# Patient Record
Sex: Female | Born: 1960
Health system: Southern US, Community
[De-identification: ages and names within clinical notes are randomized; demographics above are authoritative.]

## PROBLEM LIST (undated history)

## (undated) DIAGNOSIS — E079 Disorder of thyroid, unspecified: Secondary | ICD-10-CM

## (undated) DIAGNOSIS — C439 Malignant melanoma of skin, unspecified: Secondary | ICD-10-CM

## (undated) HISTORY — PX: TUMOR EXCISION: SHX421

## (undated) HISTORY — PX: WISDOM TOOTH EXTRACTION: SHX21

## (undated) HISTORY — PX: LIPOSUCTION: SHX10

## (undated) HISTORY — PX: ABDOMINAL HYSTERECTOMY: SHX81

---

## 2016-07-11 ENCOUNTER — Encounter (HOSPITAL_BASED_OUTPATIENT_CLINIC_OR_DEPARTMENT_OTHER): Payer: Self-pay | Admitting: *Deleted

## 2016-07-11 ENCOUNTER — Emergency Department (HOSPITAL_BASED_OUTPATIENT_CLINIC_OR_DEPARTMENT_OTHER)
Admission: EM | Admit: 2016-07-11 | Discharge: 2016-07-11 | Disposition: A | Discharge status: Home / Self Care | Payer: BLUE CROSS/BLUE SHIELD | Attending: Emergency Medicine | Admitting: Emergency Medicine

## 2016-07-11 DIAGNOSIS — M5412 Radiculopathy, cervical region: Secondary | ICD-10-CM | POA: Diagnosis not present

## 2016-07-11 DIAGNOSIS — M79622 Pain in left upper arm: Secondary | ICD-10-CM | POA: Diagnosis present

## 2016-07-11 HISTORY — DX: Disorder of thyroid, unspecified: E07.9

## 2016-07-11 HISTORY — DX: Malignant melanoma of skin, unspecified: C43.9

## 2016-07-11 MED ORDER — NAPROXEN 375 MG PO TABS: 375.0000 mg | ORAL_TABLET | Freq: Two times a day (BID) | ORAL | 0 refills | Status: DC

## 2016-07-11 MED ORDER — PREDNISONE 20 MG PO TABS: 40.0000 mg | ORAL_TABLET | Freq: Every day | ORAL | 0 refills | Status: DC

## 2016-07-11 MED FILL — predniSONE 20 MG TABS: 20 | 5 days supply | Qty: 10 | Fill #0

## 2016-07-11 MED FILL — NAPROXEN 375 MG TABLET: 375 | 10 days supply | Qty: 20 | Fill #0

## 2016-07-11 NOTE — ED Notes (Signed)
MD at bedside. 

## 2016-07-11 NOTE — ED Notes (Signed)
C/o some L arm hand fingers numb/tingling, pain comes and goes, onset ~ 0540, rates, pain 2/10, CMS/ROM intact, no drift, grips strong bilaterally, (denies: HA, nausea, dizziness, sob).

## 2016-07-11 NOTE — ED Provider Notes (Signed)
[facility] [facility] Provider Note   CSN: [id number] Arrival date & time: 07/11/16  0610     History   Chief Complaint Chief Complaint  Patient presents with  . Numbness    HPI Kathleen Matthews is a 55 y.o. female.  Patient is a 55 year old female with a history of hypothyroid disease presenting today with left arm pain and numbness. Patient woke up at 5:30 this morning with a pain in the back of her arm that intermittently radiates down to the finger and numbness and tingling of the fingers. It has waxed and waned since starting but seems to be improving. Patient works as a Theme park manager and states Saturday was a very stressful day but she always does things with her arms. Since waking up she has felt slightly nauseated but denies any shortness of breath, chest pain, back pain, abdominal pain. She has never had issues with this arm before but does recall having problems with her right shoulder in the past with radicular symptoms requiring physical therapy. She is unsure if this pain today is similar to that radicular pain she had on her right side. She does have a strong family history of cardiac disease with her brother undergoing bypass surgery in his 64s and her father having bypass surgery in his 83s. The patient does not use tobacco or abuse alcohol. She has no known cardiac history. She has been off of her Synthroid for the last 4 months and has been taking an over-the-counter thyroid supplement but denies excessive weight gain, fatigue or other complaints at this time. She states she has just not called her doctor to get her prescription filled.   The history is provided by the patient.    Past Medical History:  Diagnosis Date  . 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 Thyroid disease     There are no active problems to display for this patient.   Past Surgical History:  Procedure Laterality Date  . ABDOMINAL HYSTERECTOMY    . CESAREAN SECTION    . LIPOSUCTION    . TUMOR EXCISION      melanoma, L ankle  . WISDOM TOOTH EXTRACTION      OB History    No data available       Home Medications    Prior to Admission medications   Not on File    Family History History reviewed. No pertinent family history.  Social History Social History  Substance Use Topics  . Smoking status: Never Smoker  . Smokeless tobacco: Never Used  . Alcohol use No     Allergies   Other   Review of Systems Review of Systems  All other systems reviewed and are negative.    Physical Exam Updated Vital Signs BP 144/87 (BP Location: Left Arm)   Pulse 67   Temp 98.1 F (36.7 C) (Oral)   Resp 20   Ht 5\' 1"  (1.549 m)   Wt 122 lb (55.3 kg)   SpO2 99%   BMI 23.05 kg/m   Physical Exam  Constitutional: She is oriented to person, place, and time. She appears well-developed and well-nourished. No distress.  HENT:  Head: Normocephalic and atraumatic.  Mouth/Throat: Oropharynx is clear and moist.  Eyes: Conjunctivae and EOM are normal. Pupils are equal, round, and reactive to light.  Neck: Normal range of motion. Neck supple.  Cardiovascular: Normal rate, regular rhythm and intact distal pulses.   No murmur heard. 2+ left radial pulse.  Equal radial pulses bilaterally  Pulmonary/Chest: Effort normal and breath sounds normal. No respiratory distress. She has no wheezes. She has no rales.  Abdominal: Soft. She exhibits no distension. There is no tenderness. There is no rebound and no guarding.  Musculoskeletal: Normal range of motion. She exhibits no edema or tenderness.  Neurological: She is alert and oriented to person, place, and time.  Minimal subjective tingling in the lateral fingers of the left hand.  5/5 hand grip strength, 5/5 bicep, tripcep strength.  Minimal pain with palpation of the 5th digit and axilla.  Skin: Skin is warm and dry. No rash noted. No erythema.  Psychiatric: She has a normal mood and affect. Her behavior is normal.  Nursing note and vitals  reviewed.    ED Treatments / Results  Labs (all labs ordered are listed, but only abnormal results are displayed) Labs Reviewed - No data to display  EKG  EKG Interpretation  Date/Time:  Monday July 11 2016 07:42:52 EDT Ventricular Rate:  67 PR Interval:    QRS Duration: 86 QT Interval:  426 QTC Calculation: 450 R Axis:   58 Text Interpretation:  Sinus rhythm Low voltage, precordial leads Borderline T abnormalities, anterior leads No previous tracing Confirmed by Maryan Rued  MD, Loree Fee ([id number]) on 07/11/2016 7:52:54 AM       Radiology No results found.  Procedures Procedures (including critical care time)  Medications Ordered in ED Medications - No data to display   Initial Impression / Assessment and Plan / ED Course  I have reviewed the triage vital signs and the nursing notes.  Pertinent labs & imaging results that were available during my care of the patient were reviewed by me and considered in my medical decision making (see chart for details).  Clinical Course   Patient is a 55 year old female presenting today with left arm pain and numbness most ingested of radicular symptoms. However patient has a strong family history of cardiac disease and was concerned which is why she came in. She denies any shortness of breath, cough or respiratory complaints. She has had no chest pain. She feels slightly nauseated however she attributes that to not getting her normal amount of sleep. She has not had any vomiting or diaphoresis. She has had radicular symptoms in her right upper extremity due to her occupation which is being a Theme park manager. She does recall Saturday being a very stressful busy day where she did a lot with her upper body. However yesterday was much more relaxed. She does not recall sleeping on the left side but symptoms of been persistent since waking up. They do seem to be improving. Patient's vital signs are within normal limits. She has been out of her thyroid  replacement for the last 4 months but has been taking OTC meds. She denies any symptoms of hypothyroidism and just needs to call her doctor to get a new prescription. We'll do a screening EKG given patient's family history however symptoms are typical of radiculopathy in the C8 distribution.  EKG with mild t-wave flattening anteriorly but no other abnormalities and no signs of MI.  Final Clinical Impressions(s) / ED Diagnoses   Final diagnoses:  Cervical radiculopathy    New Prescriptions New Prescriptions   NAPROXEN (NAPROSYN) 375 MG TABLET    Take 1 tablet (375 mg total) by mouth 2 (two) times daily.   PREDNISONE (DELTASONE) 20 MG TABLET    Take 2 tablets (40 mg total) by mouth daily.     Blanchie Dessert, MD 07/11/16 [phone]

## 2016-07-11 NOTE — ED Notes (Signed)
Pt directed to pharmacy to pick up prescriptions. Ambulatory with steady gait to d/c window

## 2017-02-14 ENCOUNTER — Other Ambulatory Visit: Payer: Self-pay | Admitting: Family Medicine

## 2017-02-14 DIAGNOSIS — Z1231 Encounter for screening mammogram for malignant neoplasm of breast: Secondary | ICD-10-CM

## 2017-03-01 ENCOUNTER — Ambulatory Visit: Payer: BLUE CROSS/BLUE SHIELD

## 2018-03-18 ENCOUNTER — Other Ambulatory Visit: Payer: Self-pay | Admitting: Family Medicine

## 2018-03-18 DIAGNOSIS — Z1231 Encounter for screening mammogram for malignant neoplasm of breast: Secondary | ICD-10-CM

## 2018-05-31 ENCOUNTER — Ambulatory Visit
Admission: RE | Admit: 2018-05-31 | Discharge: 2018-05-31 | Disposition: A | Discharge status: Home / Self Care | Payer: BLUE CROSS/BLUE SHIELD | Source: Ambulatory Visit | Attending: Family Medicine | Admitting: Family Medicine

## 2018-05-31 DIAGNOSIS — Z1231 Encounter for screening mammogram for malignant neoplasm of breast: Secondary | ICD-10-CM

## 2019-07-23 ENCOUNTER — Other Ambulatory Visit: Payer: Self-pay | Admitting: Family Medicine

## 2019-07-23 ENCOUNTER — Other Ambulatory Visit: Payer: Self-pay | Admitting: Pediatrics

## 2019-07-23 DIAGNOSIS — Z1231 Encounter for screening mammogram for malignant neoplasm of breast: Secondary | ICD-10-CM

## 2019-09-09 ENCOUNTER — Ambulatory Visit
Admission: RE | Admit: 2019-09-09 | Discharge: 2019-09-09 | Disposition: A | Discharge status: Home / Self Care | Payer: BLUE CROSS/BLUE SHIELD | Source: Ambulatory Visit | Attending: Family Medicine | Admitting: Family Medicine

## 2019-09-09 ENCOUNTER — Other Ambulatory Visit: Payer: Self-pay

## 2019-09-09 DIAGNOSIS — Z1231 Encounter for screening mammogram for malignant neoplasm of breast: Secondary | ICD-10-CM

## 2020-04-01 IMAGING — MG DIGITAL SCREENING BILAT W/ TOMO W/ CAD
8 series · 8 of 24 positions shown · non-contrast
Comparison: Previous exam(s).

CLINICAL DATA: Screening.

EXAM:
DIGITAL SCREENING BILATERAL MAMMOGRAM WITH TOMO AND CAD

[L CC synth-2D]
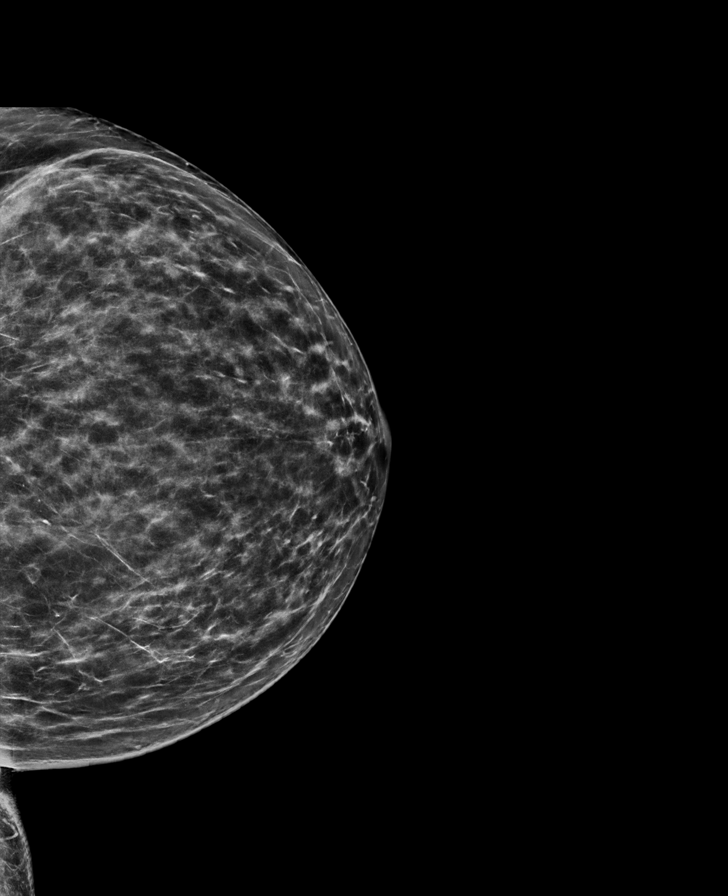

[R MLO synth-2D]
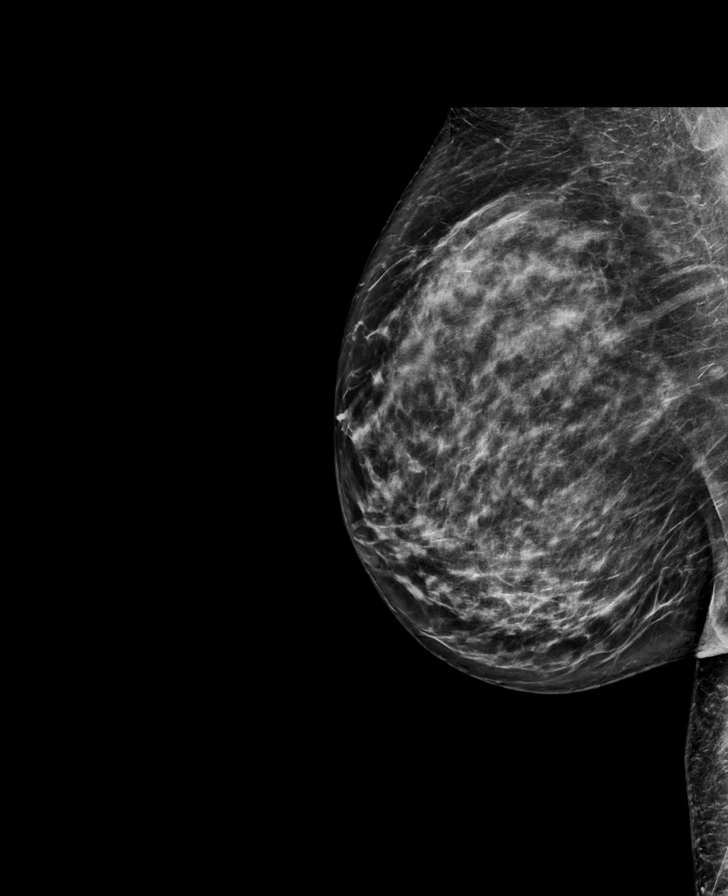

[L MLO synth-2D]
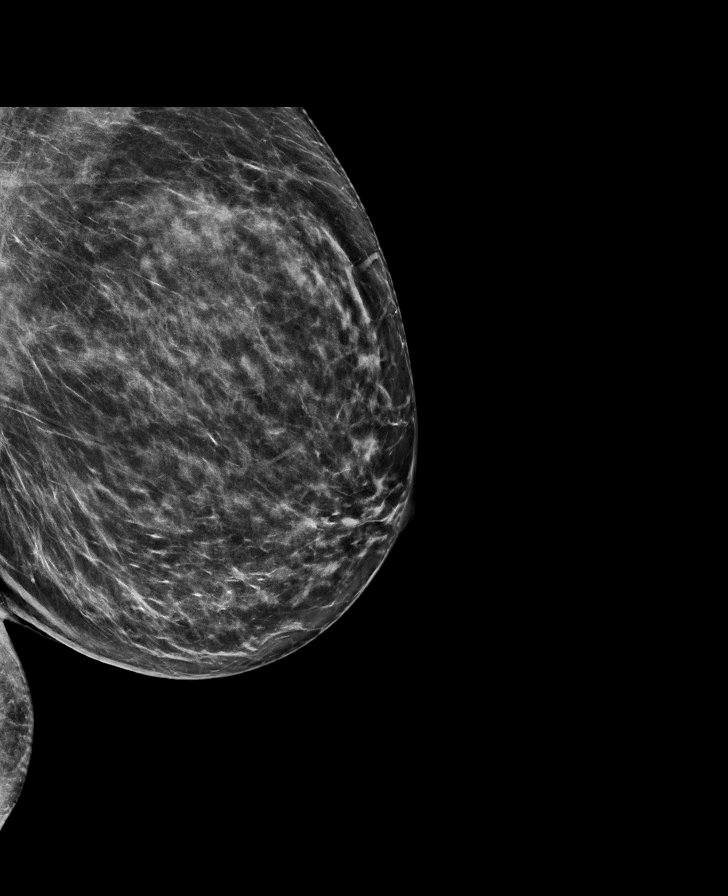

[R CC synth-2D]
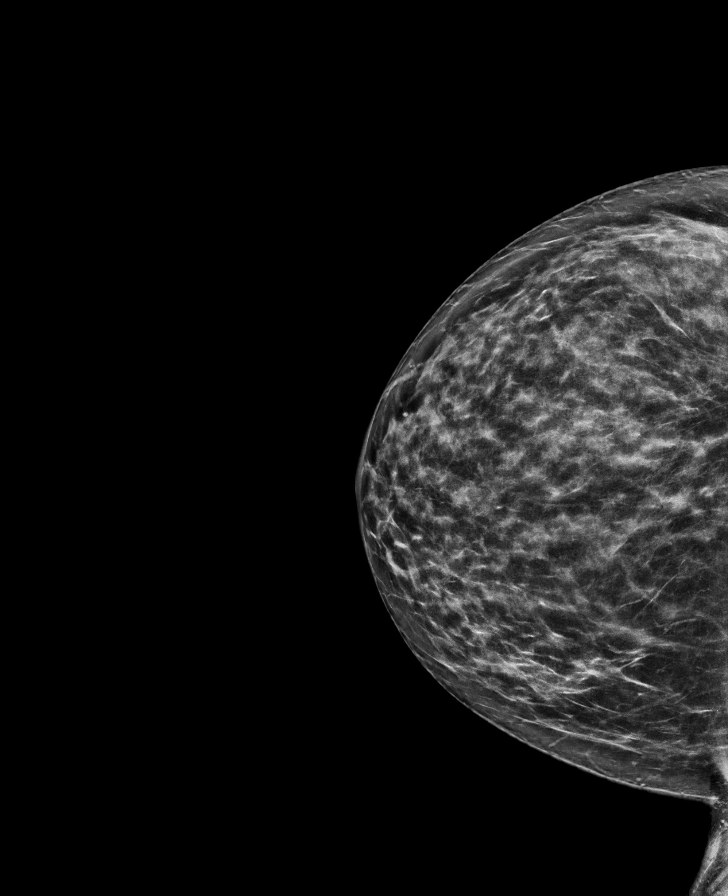

[L CC tomo · tomo slice 35/70.0]
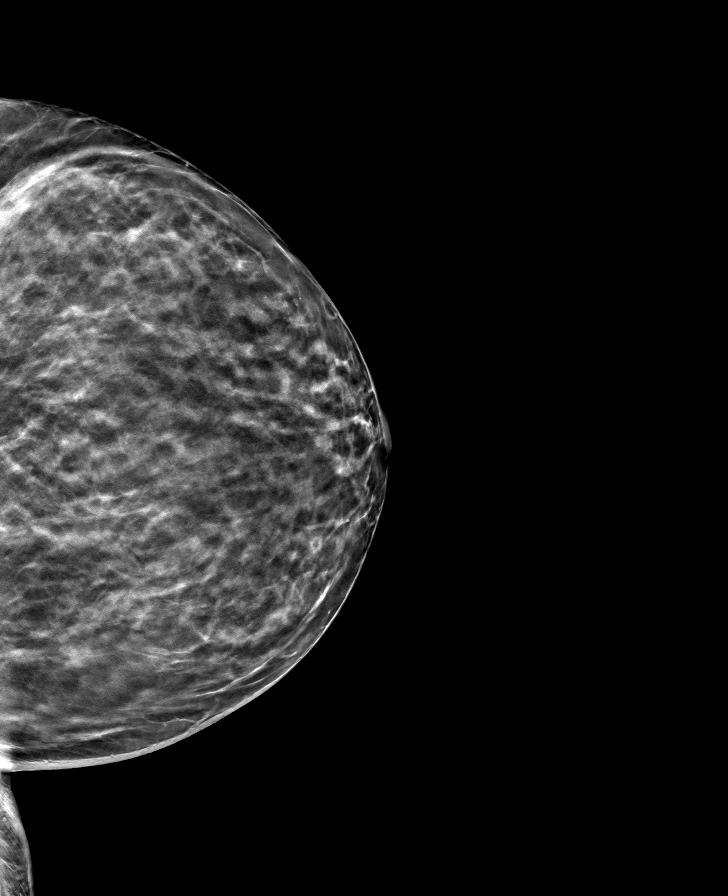

[R MLO tomo · tomo slice 39/78.0]
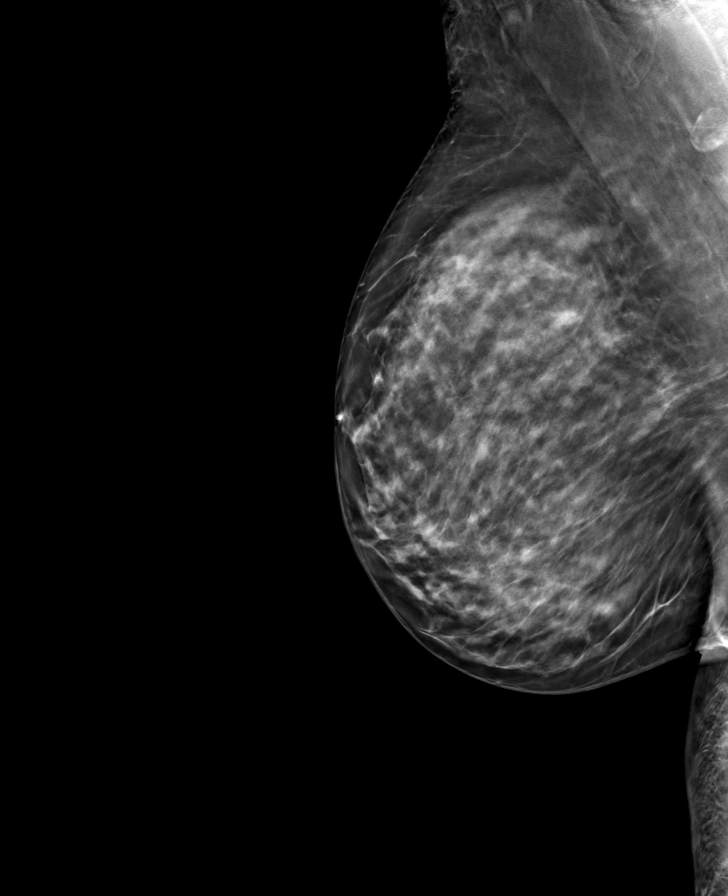

[L MLO tomo · tomo slice 39/77.0]
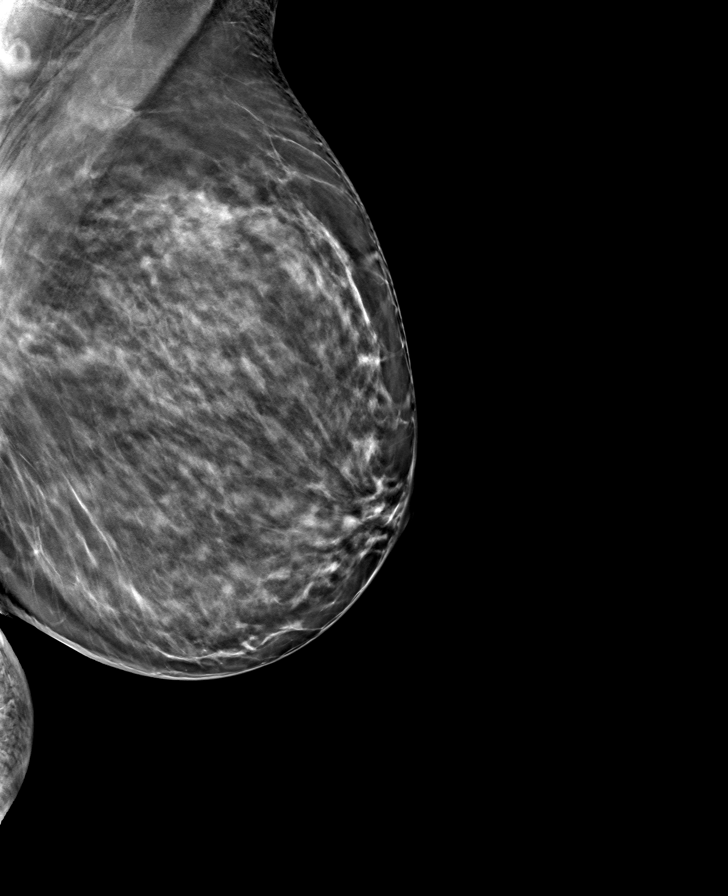

[R CC tomo · tomo slice 35/69.0]
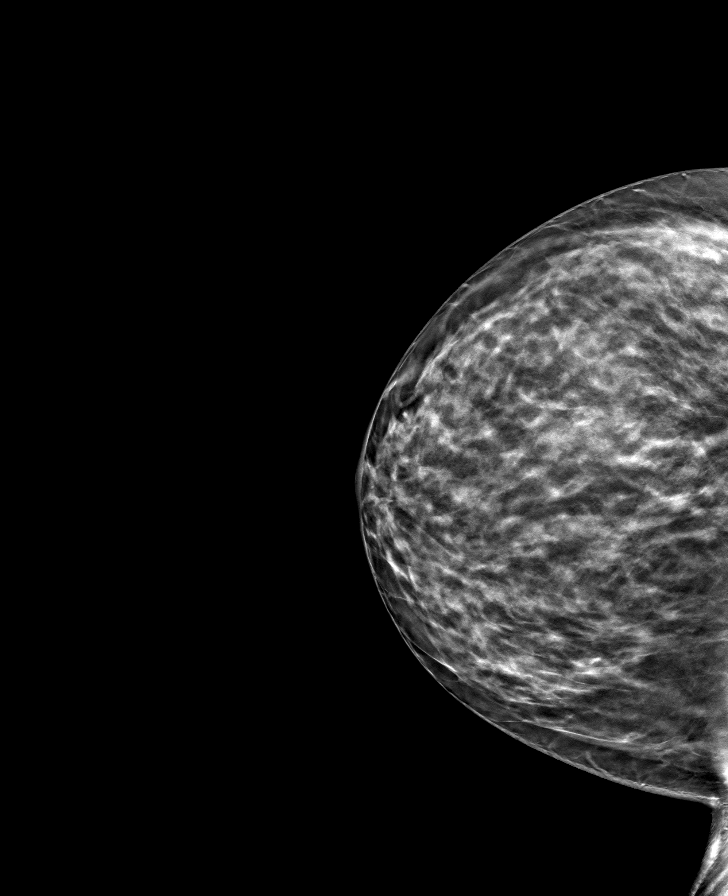

[8 of 24 positions shown; findings below may reference images not displayed]

ACR Breast Density Category c: The breast tissue is heterogeneously
dense, which may obscure small masses.
FINDINGS: There are no findings suspicious for malignancy. Images were
processed with CAD.
IMPRESSION: No mammographic evidence of malignancy. A result letter of this
screening mammogram will be mailed directly to the patient.

RECOMMENDATION:
Screening mammogram in one year. (Code:FT-U-LHB)

BI-RADS CATEGORY  1: Negative.

## 2021-03-26 ENCOUNTER — Other Ambulatory Visit: Payer: Self-pay | Admitting: Family Medicine

## 2021-03-26 DIAGNOSIS — Z1231 Encounter for screening mammogram for malignant neoplasm of breast: Secondary | ICD-10-CM

## 2021-05-27 ENCOUNTER — Ambulatory Visit
Admission: RE | Admit: 2021-05-27 | Discharge: 2021-05-27 | Disposition: A | Discharge status: Home / Self Care | Payer: BC Managed Care – PPO | Source: Ambulatory Visit | Attending: Family Medicine | Admitting: Family Medicine

## 2021-05-27 ENCOUNTER — Other Ambulatory Visit: Payer: Self-pay

## 2021-05-27 DIAGNOSIS — Z1231 Encounter for screening mammogram for malignant neoplasm of breast: Secondary | ICD-10-CM

## 2021-07-31 ENCOUNTER — Emergency Department (HOSPITAL_BASED_OUTPATIENT_CLINIC_OR_DEPARTMENT_OTHER)
Admission: EM | Admit: 2021-07-31 | Discharge: 2021-07-31 | Disposition: A | Discharge status: Home / Self Care | Payer: BC Managed Care – PPO | Attending: Emergency Medicine | Admitting: Emergency Medicine

## 2021-07-31 ENCOUNTER — Other Ambulatory Visit: Payer: Self-pay

## 2021-07-31 ENCOUNTER — Encounter (HOSPITAL_BASED_OUTPATIENT_CLINIC_OR_DEPARTMENT_OTHER): Payer: Self-pay | Admitting: *Deleted

## 2021-07-31 ENCOUNTER — Emergency Department (HOSPITAL_BASED_OUTPATIENT_CLINIC_OR_DEPARTMENT_OTHER): Payer: BC Managed Care – PPO

## 2021-07-31 DIAGNOSIS — R519 Headache, unspecified: Secondary | ICD-10-CM | POA: Diagnosis present

## 2021-07-31 DIAGNOSIS — R262 Difficulty in walking, not elsewhere classified: Secondary | ICD-10-CM | POA: Insufficient documentation

## 2021-07-31 DIAGNOSIS — Z85820 Personal history of malignant melanoma of skin: Secondary | ICD-10-CM | POA: Diagnosis not present

## 2021-07-31 DIAGNOSIS — H5711 Ocular pain, right eye: Secondary | ICD-10-CM | POA: Diagnosis not present

## 2021-07-31 MED ORDER — ONDANSETRON 4 MG PO TBDP
4.0000 mg | ORAL_TABLET | Freq: Three times a day (TID) | ORAL | 0 refills | Status: AC | PRN
Start: 2021-07-31 — End: ?

## 2021-07-31 MED ORDER — TETRACAINE HCL 0.5 % OP SOLN
2.0000 [drp] | Freq: Once | OPHTHALMIC | Status: AC
  Administered 2021-07-31: 2 [drp] via OPHTHALMIC
  Filled 2021-07-31: qty 4

## 2021-07-31 MED ORDER — ONDANSETRON 4 MG PO TBDP
4.0000 mg | ORAL_TABLET | Freq: Once | ORAL | Status: AC
  Administered 2021-07-31: 4 mg via ORAL
  Filled 2021-07-31: qty 1

## 2021-07-31 NOTE — ED Triage Notes (Signed)
Pt reports headache last Sunday, starting behind right eye, then moved over to left eye. States  "Usually it does". States she had the headache Tuesday, Thursday and Friday also. Report it returned this am and she had some relief with claritin-D. She reports pain in the teeth and she thinks it is "sinuses". Today she was advised by tele-health from her work to be checked for glaucoma

## 2021-07-31 NOTE — ED Provider Notes (Signed)
[facility] EMERGENCY DEPARTMENT Provider Note   CSN: [id number] Arrival date & time: 07/31/21  1804     History Chief Complaint  Patient presents with   Headache    Kathleen Matthews is a 60 y.o. female.  HPI     Headache started last Sunday Hard pain Was taking claritin D daytime , and stopped the nighttime one, and then stopped the claritin D and feels like symptoms worsened since then  Face hurt, teeth hurt, usually sinuses wihen that happens  Usual  2 days in a row at 3PM felt like jabbing pain right eyeball, left eye with visual changes at the bottom of vision, like rising eat, waves, like distorted glass, lasts until pain in eye goes away.  Left eye has been having visual changes off and on over the last week.   Has been having headaches for a long time off and on but new stabbing type headache and visual changes in left eye  In [place] thought had allergy induced migraines, didn't have visual changes with those, just severe headache   No numbness/weakness/difficulty talking Ears feel full "When head messed up " has some difficulty walking, feels like grazes walls sometimes but no overt difficulty walking or continuing symptoms when not having headache.  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   Past Surgical History:  Procedure Laterality Date   ABDOMINAL HYSTERECTOMY     CESAREAN SECTION     LIPOSUCTION     TUMOR EXCISION     melanoma, L ankle   WISDOM TOOTH EXTRACTION       OB History   No obstetric history on file.     No family history on file.  Social History   Tobacco Use   Smoking status: Never   Smokeless tobacco: Never   Substance Use Topics   Alcohol use: No   Drug use: No    Home Medications Prior to Admission medications   Medication Sig Start Date End Date Taking? Authorizing Provider  ondansetron (ZOFRAN ODT) 4 MG disintegrating tablet Take 1 tablet (4 mg total) by mouth every 8 (eight) hours as needed for nausea or vomiting. 07/31/21  Yes Gareth Morgan, MD  naproxen (NAPROSYN) 375 MG tablet Take 1 tablet (375 mg total) by mouth 2 (two) times daily. 07/11/16   Blanchie Dessert, MD  predniSONE (DELTASONE) 20 MG tablet Take 2 tablets (40 mg total) by mouth daily. 07/11/16   Blanchie Dessert, MD    Allergies    Other  Review of Systems   Review of Systems  Constitutional:  Positive for fatigue.  HENT:  Negative for congestion.   Eyes:  Positive for visual disturbance. Negative for pain.  Respiratory:  Negative for shortness of breath. Cough: occ.  Cardiovascular:  Negative for chest pain.  Gastrointestinal:  Positive for nausea. Negative for vomiting.  Skin:  Negative for rash.  Neurological:  Positive for headaches. Negative for syncope, facial asymmetry, speech difficulty, weakness and numbness.   Physical Exam Updated Vital Signs BP (!) 148/66   Pulse 76   Temp 98.4 F (36.9 C) (Oral)   Resp 18  Ht 5\' 1"  (1.549 m)   Wt 54.4 kg   SpO2 100%   BMI 22.67 kg/m   Physical Exam Vitals and nursing note reviewed.  Constitutional:      General: She is not in acute distress.    Appearance: She is well-developed. She is not diaphoretic.  HENT:     Head: Normocephalic and atraumatic.  Eyes:     Conjunctiva/sclera: Conjunctivae normal.     Comments: IOP 21  Cardiovascular:     Rate and Rhythm: Normal rate and regular rhythm.     Heart sounds: Normal heart sounds. No murmur heard.   No friction rub. No gallop.  Pulmonary:     Effort: Pulmonary effort is normal. No respiratory distress.     Breath sounds: Normal breath sounds. No wheezing or rales.  Abdominal:     General: There  is no distension.     Palpations: Abdomen is soft.     Tenderness: There is no abdominal tenderness. There is no guarding.  Musculoskeletal:        General: No tenderness.     Cervical back: Normal range of motion.  Skin:    General: Skin is warm and dry.     Findings: No erythema or rash.  Neurological:     Mental Status: She is alert and oriented to person, place, and time.    ED Results / Procedures / Treatments   Labs (all labs ordered are listed, but only abnormal results are displayed) Labs Reviewed - No data to display  EKG None  Radiology CT Head Wo Contrast  Result Date: 07/31/2021 CLINICAL DATA:  Headache. EXAM: CT HEAD WITHOUT CONTRAST TECHNIQUE: Contiguous axial images were obtained from the base of the skull through the vertex without intravenous contrast. COMPARISON:  None. FINDINGS: Brain: No evidence of acute infarction, hemorrhage, hydrocephalus, extra-axial collection or mass lesion/mass effect. Vascular: No hyperdense vessel or unexpected calcification. Skull: Normal. Negative for fracture or focal lesion. Sinuses/Orbits: No acute finding. Other: None. IMPRESSION: No acute intracranial pathology. Electronically Signed   By: Virgina Norfolk M.D.   On: 07/31/2021 22:27    Procedures Procedures   Medications Ordered in ED Medications  tetracaine (PONTOCAINE) 0.5 % ophthalmic solution 2 drop (2 drops Both Eyes Given 07/31/21 2327)  ondansetron (ZOFRAN-ODT) disintegrating tablet 4 mg (4 mg Oral Given 07/31/21 2331)    ED Course  I have reviewed the triage vital signs and the nursing notes.  Pertinent labs & imaging results that were available during my care of the patient were reviewed by me and considered in my medical decision making (see chart for details).    MDM Rules/Calculators/A&P                           60yo female with history of melanoma, thyroid disease presents with concern for headache located around the right eye, nausea referred from  tele health.  IOP normal, no sign of acute angle closure glaucoma.  Does not have focal findings on exam to suggest CVA, including normal EOM, normal visual fields.  Had a stabbing headache, not described as sudden onset worst headache of life, and improved, and in setting of normal CT head have low suspicion for ICH and after discussion with patient agree to not pursue further CTA imaging, and also do not feel lumbar puncture indicated.  Doubt dural venous thrombosis, meningitis.  Describes sinus pain but does not have signs of sinusitis on CT.  Recommend  continued outpatient follow up for headaches, consideration of outpatient MRI.  Visual changes described with headache sound like they could be migrainous, however recommend close outpatient follow up. Given rx for zofran. Patient discharged in stable condition with understanding of reasons to return.     Final Clinical Impression(s) / ED Diagnoses Final diagnoses:  Acute nonintractable headache, unspecified headache type    Rx / DC Orders ED Discharge Orders          Ordered    ondansetron (ZOFRAN ODT) 4 MG disintegrating tablet  Every 8 hours PRN        07/31/21 2328             Gareth Morgan, MD 08/01/21 1148

## 2022-09-08 ENCOUNTER — Other Ambulatory Visit: Payer: Self-pay | Admitting: Family Medicine

## 2022-09-08 DIAGNOSIS — Z1231 Encounter for screening mammogram for malignant neoplasm of breast: Secondary | ICD-10-CM

## 2022-09-09 ENCOUNTER — Ambulatory Visit
Admission: RE | Admit: 2022-09-09 | Discharge: 2022-09-09 | Disposition: A | Discharge status: Home / Self Care | Payer: BC Managed Care – PPO | Source: Ambulatory Visit | Attending: Family Medicine | Admitting: Family Medicine

## 2022-09-09 DIAGNOSIS — Z1231 Encounter for screening mammogram for malignant neoplasm of breast: Secondary | ICD-10-CM

## 2022-09-13 ENCOUNTER — Other Ambulatory Visit: Payer: Self-pay | Admitting: Family Medicine

## 2022-09-13 DIAGNOSIS — R928 Other abnormal and inconclusive findings on diagnostic imaging of breast: Secondary | ICD-10-CM

## 2022-09-29 ENCOUNTER — Other Ambulatory Visit: Payer: BC Managed Care – PPO

## 2022-10-20 ENCOUNTER — Other Ambulatory Visit: Payer: BC Managed Care – PPO

## 2022-10-21 ENCOUNTER — Other Ambulatory Visit: Payer: BC Managed Care – PPO

## 2022-12-02 ENCOUNTER — Ambulatory Visit
Admission: RE | Admit: 2022-12-02 | Discharge: 2022-12-02 | Disposition: A | Discharge status: Home / Self Care | Payer: BC Managed Care – PPO | Source: Ambulatory Visit | Attending: Family Medicine | Admitting: Family Medicine

## 2022-12-02 ENCOUNTER — Ambulatory Visit: Admission: RE | Admit: 2022-12-02 | Payer: BC Managed Care – PPO | Source: Ambulatory Visit

## 2022-12-02 DIAGNOSIS — R928 Other abnormal and inconclusive findings on diagnostic imaging of breast: Secondary | ICD-10-CM

## 2023-01-06 ENCOUNTER — Other Ambulatory Visit: Payer: Self-pay | Admitting: Family Medicine

## 2023-01-06 DIAGNOSIS — R1011 Right upper quadrant pain: Secondary | ICD-10-CM

## 2023-01-20 ENCOUNTER — Ambulatory Visit
Admission: RE | Admit: 2023-01-20 | Discharge: 2023-01-20 | Disposition: A | Discharge status: Home / Self Care | Payer: BC Managed Care – PPO | Source: Ambulatory Visit | Attending: Family Medicine | Admitting: Family Medicine

## 2023-01-20 DIAGNOSIS — R1011 Right upper quadrant pain: Secondary | ICD-10-CM

## 2023-09-28 ENCOUNTER — Ambulatory Visit: Payer: BC Managed Care – PPO | Admitting: Internal Medicine

## 2023-10-10 ENCOUNTER — Encounter (HOSPITAL_BASED_OUTPATIENT_CLINIC_OR_DEPARTMENT_OTHER): Payer: Self-pay

## 2023-10-25 ENCOUNTER — Telehealth (INDEPENDENT_AMBULATORY_CARE_PROVIDER_SITE_OTHER): Payer: Self-pay | Admitting: Otolaryngology

## 2023-10-25 NOTE — Telephone Encounter (Signed)
 Confirmed appt & location [id number]

## 2023-10-26 ENCOUNTER — Ambulatory Visit (INDEPENDENT_AMBULATORY_CARE_PROVIDER_SITE_OTHER): Payer: BC Managed Care – PPO | Admitting: Otolaryngology

## 2023-10-26 ENCOUNTER — Encounter (INDEPENDENT_AMBULATORY_CARE_PROVIDER_SITE_OTHER): Payer: Self-pay

## 2023-10-26 VITALS — BP 145/82 | HR 74 | Ht 61.0 in | Wt 126.0 lb

## 2023-10-26 DIAGNOSIS — J310 Chronic rhinitis: Secondary | ICD-10-CM

## 2023-10-26 DIAGNOSIS — H6993 Unspecified Eustachian tube disorder, bilateral: Secondary | ICD-10-CM | POA: Diagnosis not present

## 2023-10-26 DIAGNOSIS — J343 Hypertrophy of nasal turbinates: Secondary | ICD-10-CM | POA: Diagnosis not present

## 2023-10-26 DIAGNOSIS — H6983 Other specified disorders of Eustachian tube, bilateral: Secondary | ICD-10-CM

## 2023-10-26 DIAGNOSIS — R0981 Nasal congestion: Secondary | ICD-10-CM

## 2023-10-27 DIAGNOSIS — J310 Chronic rhinitis: Secondary | ICD-10-CM | POA: Insufficient documentation

## 2023-10-27 DIAGNOSIS — J343 Hypertrophy of nasal turbinates: Secondary | ICD-10-CM | POA: Insufficient documentation

## 2023-10-27 DIAGNOSIS — H6983 Other specified disorders of Eustachian tube, bilateral: Secondary | ICD-10-CM | POA: Insufficient documentation

## 2023-10-27 NOTE — Progress Notes (Signed)
Patient ID: Kathleen Matthews, female   DOB: 1961/10/03, 63 y.o.   MRN: [id number]  Follow-up: Bilateral eustachian tube dysfunction, middle ear effusion  HPI: The patient is a 63 year old female who returns today for follow-up evaluation.  She was last seen in July 2024.  At that time, she was complaining of clogging sensation in her ears.  She was noted to have bilateral eustachian tube dysfunction.  Her hearing test showed bilateral mild high-frequency sensorineural hearing loss.  The patient returns today reporting 1 episode of sinusitis in December 2024.  She was diagnosed with bilateral middle ear effusion.  She has been using Flonase nasal spray intermittently.  She continues to have occasional fluid sensation in her ears.  She denies any significant otalgia, otorrhea, or vertigo.  Exam: General: Communicates without difficulty, well nourished, no acute distress. Head: Normocephalic, no evidence injury, no tenderness, facial buttresses intact without stepoff. Face/sinus: No tenderness to palpation and percussion. Facial movement is normal and symmetric. Eyes: PERRL, EOMI. No scleral icterus, conjunctivae clear. Neuro: CN II exam reveals vision grossly intact.  No nystagmus at any point of gaze. Ears: Auricles well formed without lesions.  Ear canals are intact without mass or lesion.  No erythema or edema is appreciated.  The TMs are intact without fluid. Nose: External evaluation reveals normal support and skin without lesions.  Dorsum is intact.  Anterior rhinoscopy reveals congested mucosa over anterior aspect of inferior turbinates and intact septum.  No purulence noted. Oral:  Oral cavity and oropharynx are intact, symmetric, without erythema or edema.  Mucosa is moist without lesions. Neck: Full range of motion without pain.  There is no significant lymphadenopathy.  No masses palpable.  Thyroid bed within normal limits to palpation.  Parotid glands and submandibular glands equal bilaterally without  mass.  Trachea is midline. Neuro:  CN 2-12 grossly intact.   Assessment: 1.  The patient's history is consistent with bilateral eustachian tube dysfunction. 2.  Chronic rhinitis with nasal mucosal congestion and bilateral inferior turbinate hypertrophy. 3.  Her ear canals, tympanic membranes, and middle ear spaces are normal today.  No middle ear effusion is noted.  Plan: 1.  The physical exam findings are reviewed with the patient. 2.  The patient is reassured that her middle ear effusion has resolved. 3.  Flonase nasal spray 2 sprays each nostril daily.  The importance of consistent daily use is discussed. 4.  Valsalva exercise multiple times a day. 5.  The patient will return for reevaluation in 6 weeks.

## 2023-12-07 ENCOUNTER — Telehealth (INDEPENDENT_AMBULATORY_CARE_PROVIDER_SITE_OTHER): Payer: Self-pay | Admitting: Otolaryngology

## 2023-12-07 NOTE — Telephone Encounter (Signed)
 Left vm to confirm appt and address for 12/08/2023.

## 2023-12-08 ENCOUNTER — Ambulatory Visit (INDEPENDENT_AMBULATORY_CARE_PROVIDER_SITE_OTHER): Payer: BC Managed Care – PPO | Admitting: Otolaryngology

## 2023-12-08 ENCOUNTER — Encounter (INDEPENDENT_AMBULATORY_CARE_PROVIDER_SITE_OTHER): Payer: Self-pay

## 2023-12-08 VITALS — BP 153/78 | Ht 61.0 in | Wt 126.0 lb

## 2023-12-08 DIAGNOSIS — R0981 Nasal congestion: Secondary | ICD-10-CM

## 2023-12-08 DIAGNOSIS — H6980 Other specified disorders of Eustachian tube, unspecified ear: Secondary | ICD-10-CM | POA: Diagnosis not present

## 2023-12-08 DIAGNOSIS — J343 Hypertrophy of nasal turbinates: Secondary | ICD-10-CM | POA: Diagnosis not present

## 2023-12-08 DIAGNOSIS — J310 Chronic rhinitis: Secondary | ICD-10-CM

## 2023-12-08 DIAGNOSIS — H6983 Other specified disorders of Eustachian tube, bilateral: Secondary | ICD-10-CM

## 2023-12-10 NOTE — Progress Notes (Signed)
 Patient ID: Kathleen Matthews, female   DOB: [birth year], 63 y.o.   MRN: [id number]  Follow-up: Bilateral eustachian tube dysfunction  HPI: The patient is a 63 year old female who returns today for her follow-up evaluation.  The patient has a history of bilateral eustachian tube dysfunction and middle ear effusion.  At her last visit in January 2025, her middle ear effusion had resolved.  She was continued on her Flonase nasal spray and Valsalva exercise.  The patient returns today reporting improvement in her eustachian tube dysfunction.  The clogging sensation in her ears has decreased.  She denies any recent sinusitis.  She is able to breathe through both nostrils.  Exam: General: Communicates without difficulty, well nourished, no acute distress. Head: Normocephalic, no evidence injury, no tenderness, facial buttresses intact without stepoff. Face/sinus: No tenderness to palpation and percussion. Facial movement is normal and symmetric. Eyes: PERRL, EOMI. No scleral icterus, conjunctivae clear. Neuro: CN II exam reveals vision grossly intact.  No nystagmus at any point of gaze. Ears: Auricles well formed without lesions.  Ear canals are intact without mass or lesion.  No erythema or edema is appreciated.  The TMs are intact without fluid. Nose: External evaluation reveals normal support and skin without lesions.  Dorsum is intact.  Anterior rhinoscopy reveals congested mucosa over anterior aspect of inferior turbinates and intact septum.  No purulence noted. Oral:  Oral cavity and oropharynx are intact, symmetric, without erythema or edema.  Mucosa is moist without lesions. Neck: Full range of motion without pain.  There is no significant lymphadenopathy.  No masses palpable.  Thyroid bed within normal limits to palpation.  Parotid glands and submandibular glands equal bilaterally without mass.  Trachea is midline. Neuro:  CN 2-12 grossly intact.    Assessment: 1.  Clinically improved eustachian tube  dysfunction. 2.  Chronic rhinitis with nasal mucosal congestion and bilateral inferior turbinate hypertrophy.  The severity of her nasal congestion has decreased.  Plan: 1.  The physical exam findings are reviewed with the patient. 2.  Continue with Flonase nasal spray as needed. 3.  The patient is encouraged to call with any questions or concerns.

## 2024-02-15 ENCOUNTER — Encounter (HOSPITAL_BASED_OUTPATIENT_CLINIC_OR_DEPARTMENT_OTHER): Payer: Self-pay

## 2024-03-20 ENCOUNTER — Other Ambulatory Visit: Payer: Self-pay | Admitting: Family Medicine

## 2024-03-20 DIAGNOSIS — Z1231 Encounter for screening mammogram for malignant neoplasm of breast: Secondary | ICD-10-CM

## 2024-07-08 ENCOUNTER — Encounter (HOSPITAL_BASED_OUTPATIENT_CLINIC_OR_DEPARTMENT_OTHER): Payer: Self-pay

## 2024-07-09 ENCOUNTER — Encounter (HOSPITAL_BASED_OUTPATIENT_CLINIC_OR_DEPARTMENT_OTHER): Payer: Self-pay | Admitting: Internal Medicine

## 2024-07-09 ENCOUNTER — Ambulatory Visit (INDEPENDENT_AMBULATORY_CARE_PROVIDER_SITE_OTHER): Admitting: Internal Medicine

## 2024-07-09 VITALS — BP 122/60 | HR 69 | Ht 61.0 in | Wt 135.0 lb

## 2024-07-09 DIAGNOSIS — M791 Myalgia, unspecified site: Secondary | ICD-10-CM

## 2024-07-09 DIAGNOSIS — E781 Pure hyperglyceridemia: Secondary | ICD-10-CM | POA: Diagnosis not present

## 2024-07-09 DIAGNOSIS — T466X5A Adverse effect of antihyperlipidemic and antiarteriosclerotic drugs, initial encounter: Secondary | ICD-10-CM

## 2024-07-09 DIAGNOSIS — T466X5D Adverse effect of antihyperlipidemic and antiarteriosclerotic drugs, subsequent encounter: Secondary | ICD-10-CM

## 2024-07-09 DIAGNOSIS — Z79899 Other long term (current) drug therapy: Secondary | ICD-10-CM | POA: Diagnosis not present

## 2024-07-09 NOTE — Progress Notes (Signed)
 LIPID CLINIC CONSULT NOTE  Chief Complaint:  Manage dyslipidemia  Primary Care Physician: Kathleen Darice CROME, MD  Primary Cardiologist:  None  HPI:  Kathleen Matthews is a 63 y.o. female who is being seen today for the evaluation of dyslipidemia at the request of Kathleen, Darice CROME, MD. this a pleasant 63 year old female kindly referred for evaluation management of dyslipidemia.  Primarily she has a history of high triglycerides and was noted on labs recently to have total cholesterol 284, triglycerides 989 and HDL 34.  LDL was not calculated due to high triglycerides.  She had previously been on 2 statins both of which caused myalgias and recently was given samples for Repatha.  Is not clear whether she had prior authorization for this but she did recently pick up a prescription and has taken 1 dose of Repatha a week ago.  She is scheduled to take another dose next week.  She has not had repeat lipid testing since then.  Although Repatha seems to be better tolerated without the severe myalgias that she had on statins, it is mostly ineffective at lowering triglycerides and primarily is an LDL lowering drug.  We did discuss some other medical options for her although she said she would prefer natural routes to therapy.  There is heart disease in her family and her brother, grandparents and other family members who have high cholesterol.  Diet seems to be reasonably high in saturated fats and butter and I provided dietary information today to help her with that.  She denies any alcohol use.  PMHx:  Past Medical History:  Diagnosis Date   Melanoma (HCC)    L ankle   Thyroid disease     Past Surgical History:  Procedure Laterality Date   ABDOMINAL HYSTERECTOMY     CESAREAN SECTION     LIPOSUCTION     TUMOR EXCISION     melanoma, L ankle   WISDOM TOOTH EXTRACTION      FAMHx:  Family History  Problem Relation Age of Onset   Thyroid disease Mother    Stroke Mother        multiple TIAs    Kidney disease Mother    Diabetes Mother    Hyperlipidemia Mother    Thyroid disease Father    Cancer Father        kidney   Arrhythmia Father    Atrial fibrillation Father    Heart Problems Father        unsure of what type of surgery   Arrhythmia Brother    Diabetes Brother        prediabetic   Heart disease Brother        has stents   Atrial fibrillation Brother    Diabetes Maternal Grandmother    Hyperlipidemia Maternal Grandmother    Hyperlipidemia Maternal Grandfather    Heart attack Paternal Grandmother        cause of death massive MI   Heart Problems Maternal Cousin        some type of heart surgery at a young age   Hyperlipidemia Maternal Cousin    Hyperlipidemia Maternal Uncle     SOCHx:   reports that she has never smoked. She has never used smokeless tobacco. She reports that she does not drink alcohol and does not use drugs.  ALLERGIES:  Allergies  Allergen Reactions   Other     A med given for strep 25 years ago, not sure, OK to have PCN as  far as I know    ROS: Pertinent items noted in HPI and remainder of comprehensive ROS otherwise negative.  HOME MEDS: Current Outpatient Medications on File Prior to Visit  Medication Sig Dispense Refill   ARMOUR THYROID 60 MG tablet Take 60 mg by mouth daily.     Ascorbic Acid (VITAMIN C PO) Take 1 tablet by mouth daily. Does not take every day     Evolocumab (REPATHA [place]) Inject into the skin as directed.     GLUCOSAMINE-CHONDROITIN PO Take 1 tablet by mouth daily. Does not take every day     loratadine (CLARITIN) 10 MG tablet Take 10 mg by mouth daily as needed for allergies.     MAGNESIUM PO Take 1 tablet by mouth daily. Does not take every day     ondansetron  (ZOFRAN  ODT) 4 MG disintegrating tablet Take 1 tablet (4 mg total) by mouth every 8 (eight) hours as needed for nausea or vomiting. 20 tablet 0   Phenylephrine-APAP-guaiFENesin (TYLENOL SINUS SEVERE PO) Take 0.5 tablets by mouth daily as needed  (allergies).     Pseudoephedrine HCl (SUDAFED PO) daily as needed (rarely takes).     VITAMIN D PO Take 1 tablet by mouth daily. Does not take every day     No current facility-administered medications on file prior to visit.    LABS/IMAGING: No results found for this or any previous visit (from the past 48 hours). No results found.  LIPID PANEL: No results found for: CHOL, TRIG, HDL, CHOLHDL, VLDL, LDLCALC, LDLDIRECT  No results found for: LIPOA   WEIGHTS: Wt Readings from Last 3 Encounters:  07/09/24 135 lb (61.2 kg)  12/08/23 126 lb (57.2 kg)  10/26/23 126 lb (57.2 kg)    VITALS: BP 122/60   Pulse 69   Ht 5' 1 (1.549 m)   Wt 135 lb (61.2 kg)   SpO2 97%   BMI 25.51 kg/m   EXAM: Deferred  EKG: Deferred  ASSESSMENT: Severe hypertriglyceridemia Family history of heart disease in a first-degree relative Statin intolerance-severe myalgias  PLAN: 1.   [name] has severe hypertriglyceridemia and a family history of heart disease in first-degree relatives.  She has not had any demonstrated coronary disease and I offered calcium scoring which she wanted to consider.  She could not tolerate statins and was given Repatha samples and possibly has had approval of that.  I would like her to take a second dose and repeat her lipids including NMR and LP(a) at a total of 1 month after starting the Repatha to see the effects on her lipids.  While this is primarily not a triglyceride lowering drug it may play a role if her direct LDL is still elevated.  Other medications that do benefit triglyceride lowering including ezetimibe, Nexletol, fenofibrate or fish oil.  These may be better options for her going forward.  Will discuss this further after repeating her lipids.  Thanks again for the kind referral.  Kathleen KYM Maxcy, MD, [facility], FNLA, FACP  [facility]  [facility]  Medical Director of the Advanced Lipid Disorders &  Cardiovascular Risk Reduction  Clinic Diplomate of the American Board of Clinical Lipidology Attending Cardiologist  Direct Dial: [phone]  Fax: [phone]  Website:  [email or web address] Kathleen Matthews 07/09/2024, 12:55 PM

## 2024-07-09 NOTE — Patient Instructions (Signed)
 Medication Instructions:   Your physician recommends that you continue on your current medications as directed. Please refer to the Current Medication list given to you today.  *If you need a refill on your cardiac medications before your next appointment, please call your pharmacy*  Lab Work:  In 3 weeks here at [facility] on the 3rd floor [address]--NMR LIPOPROFILE, APOLIPOPROTEIN B, AND DIRECT LDL--PLEASE COME FASTING TO THIS LAB APPOINTMENT  If you have labs (blood work) drawn today and your tests are completely normal, you will receive your results only by: MyChart Message (if you have MyChart) OR A paper copy in the mail If you have any lab test that is abnormal or we need to change your treatment, we will call you to review the results.    Follow-Up:  AS NEEDED WITH DR. HILTY
